# Patient Record
Sex: Male | Born: 1965 | Race: White | Hispanic: No | Marital: Single | Smoking: Never smoker
Health system: Southern US, Community
[De-identification: ages and names within clinical notes are randomized; demographics above are authoritative.]

## PROBLEM LIST (undated history)

## (undated) DIAGNOSIS — R51 Headache: Secondary | ICD-10-CM

## (undated) DIAGNOSIS — T7840XA Allergy, unspecified, initial encounter: Secondary | ICD-10-CM

## (undated) DIAGNOSIS — R519 Headache, unspecified: Secondary | ICD-10-CM

## (undated) HISTORY — DX: Allergy, unspecified, initial encounter: T78.40XA

## (undated) HISTORY — DX: Headache: R51

## (undated) HISTORY — PX: APPENDECTOMY: SHX54

## (undated) HISTORY — DX: Headache, unspecified: R51.9

## (undated) HISTORY — PX: EYE SURGERY: SHX253

## (undated) HISTORY — PX: NASAL FRACTURE SURGERY: SHX718

---

## 2007-07-20 ENCOUNTER — Emergency Department (HOSPITAL_COMMUNITY): Admission: EM | Admit: 2007-07-20 | Discharge: 2007-07-21 | Payer: Self-pay | Admitting: Emergency Medicine

## 2009-01-01 ENCOUNTER — Emergency Department (HOSPITAL_COMMUNITY): Admission: EM | Admit: 2009-01-01 | Discharge: 2009-01-01 | Payer: Self-pay | Admitting: Emergency Medicine

## 2009-05-11 IMAGING — CR DG CHEST 2V
2 series · 2 of 2 positions shown · non-contrast
Comparison: None

CLINICAL DATA: Chest pain.

CHEST - 2 VIEW

[w chest pa]
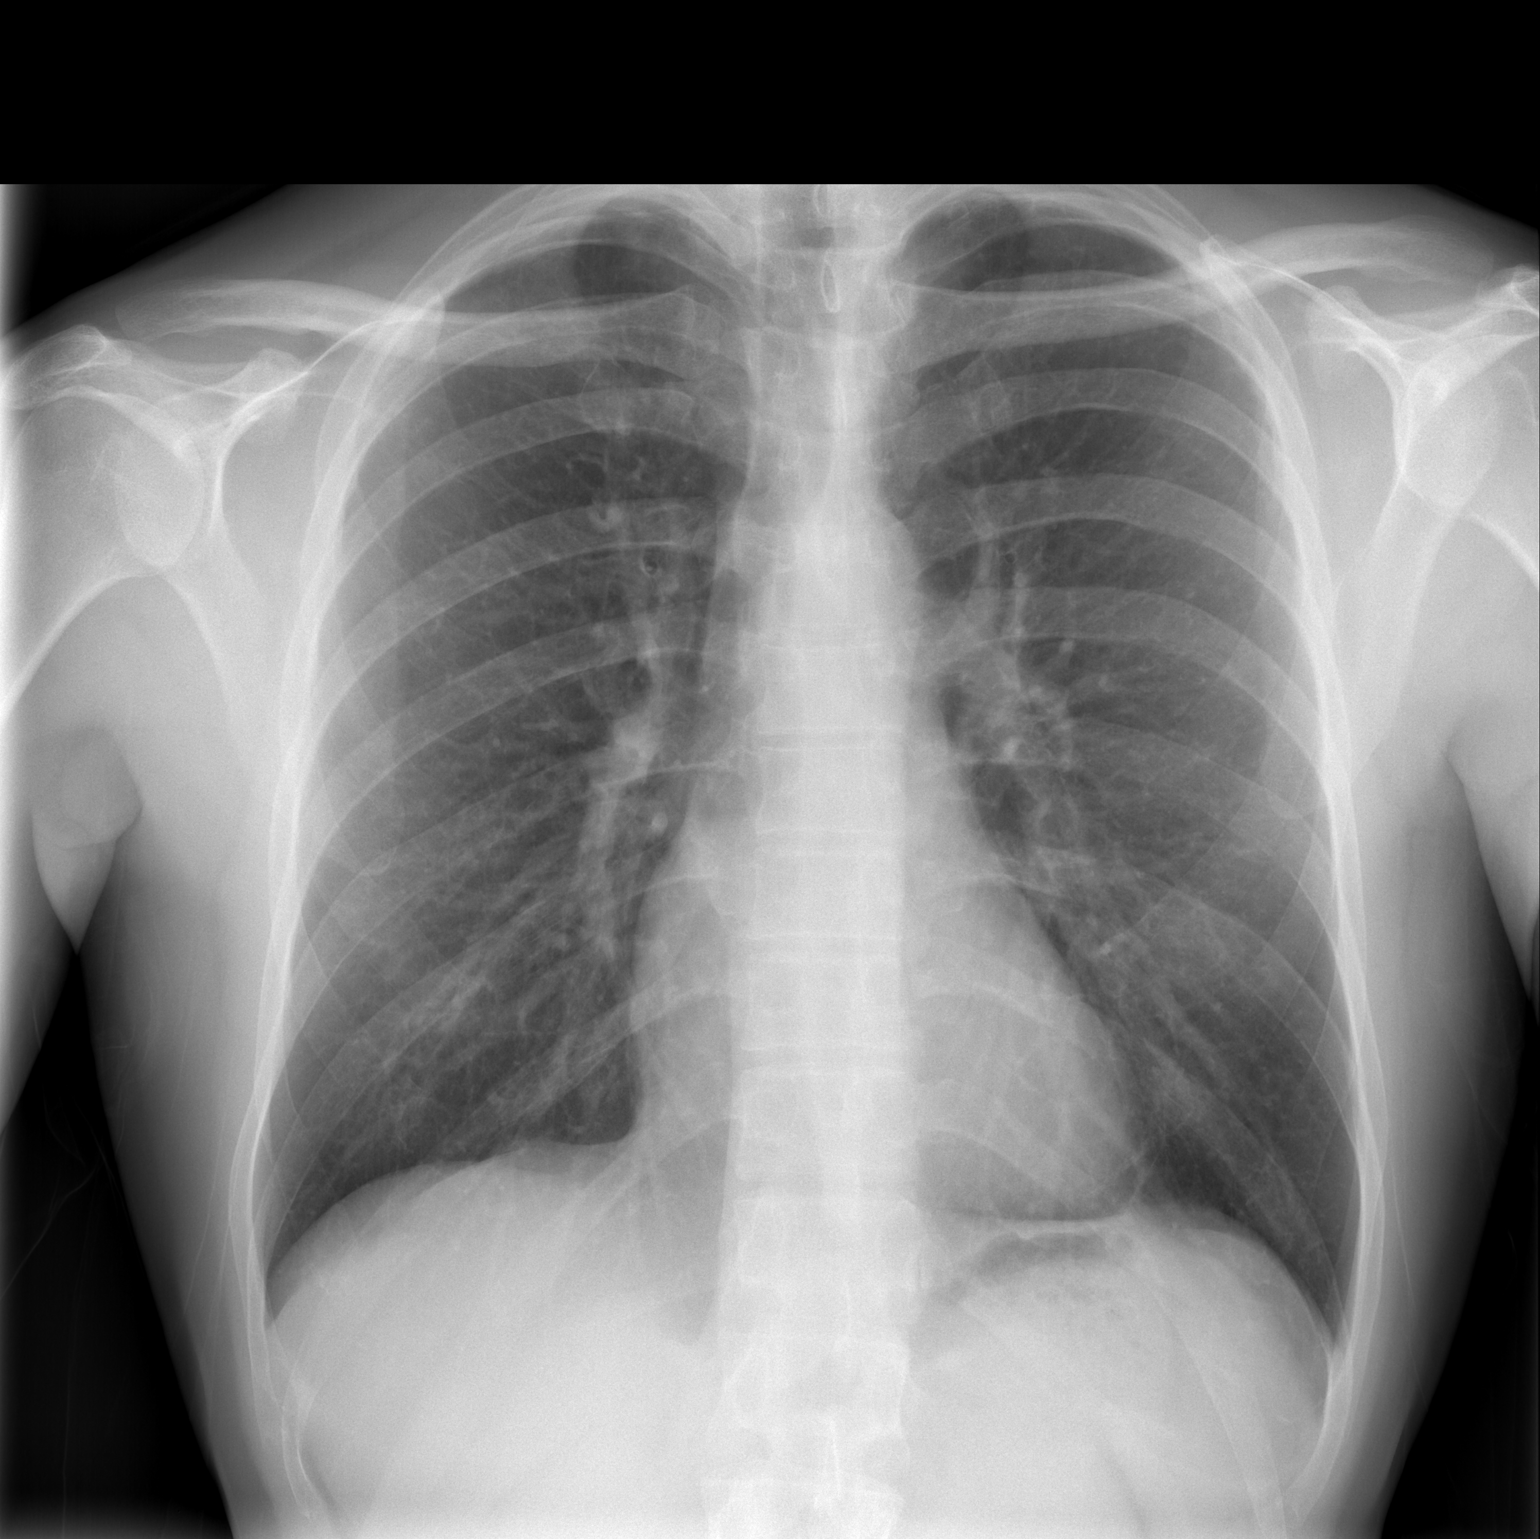

[w chest lat]
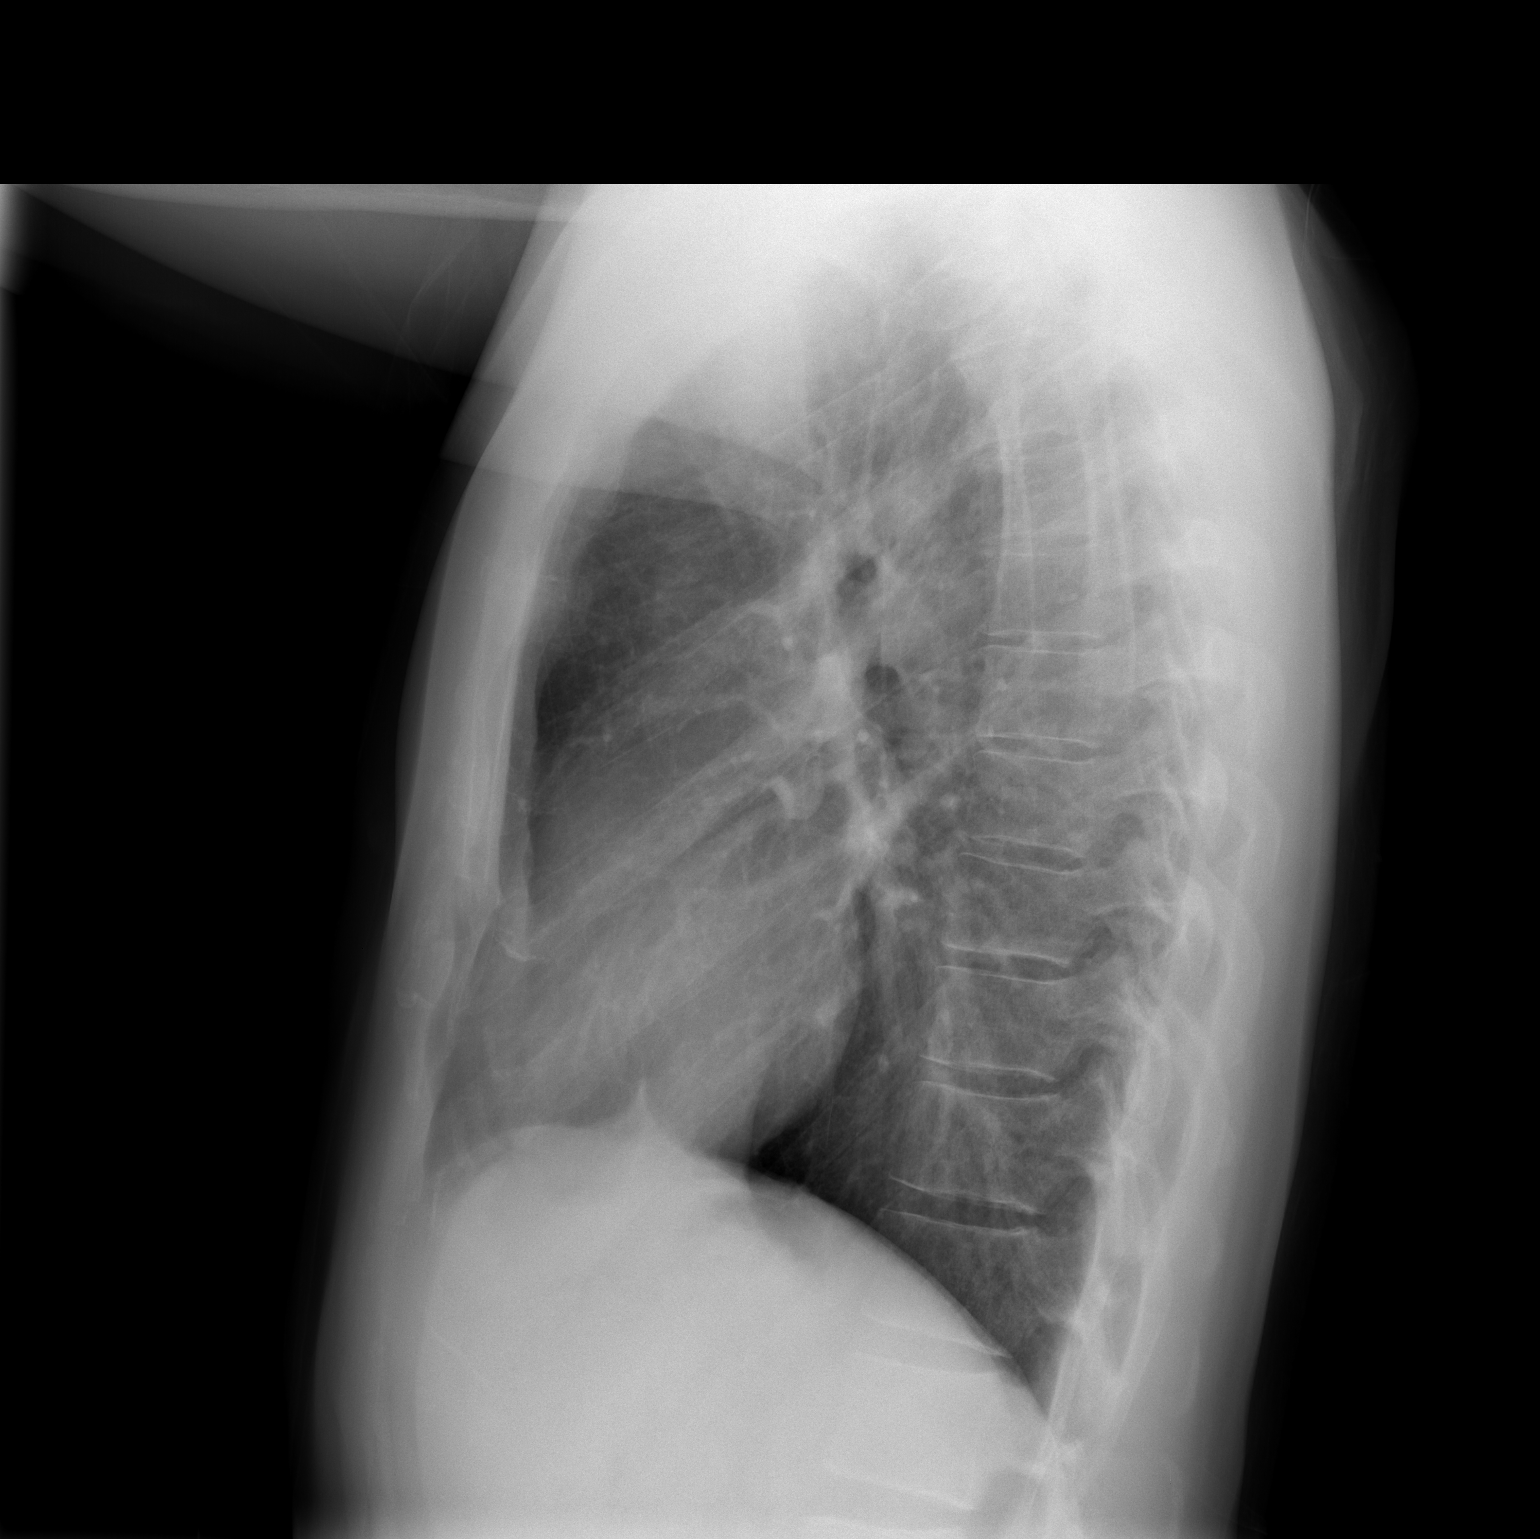

[2 of 2 positions shown; findings below may reference images not displayed]

FINDINGS: Minimal pectus excavatum deformity.  Biapical pleural
thickening. Midline trachea.  Normal heart size and mediastinal
contours.  No pleural effusion or pneumothorax.  Mild peribronchial
thickening.  Clear lungs.
IMPRESSION: 1.  No acute cardiopulmonary disease.
2.  Mild peribronchial thickening which may relate to chronic
bronchitis or smoking.

## 2010-06-21 LAB — POCT I-STAT, CHEM 8
BUN: 11 mg/dL (ref 6–23)
Chloride: 107 mEq/L (ref 96–112)
Creatinine, Ser: 1 mg/dL (ref 0.4–1.5)
Glucose, Bld: 156 mg/dL — ABNORMAL HIGH (ref 70–99)
Hemoglobin: 18.7 g/dL — ABNORMAL HIGH (ref 13.0–17.0)
Potassium: 3.3 mEq/L — ABNORMAL LOW (ref 3.5–5.1)
Sodium: 142 mEq/L (ref 135–145)

## 2010-09-19 ENCOUNTER — Emergency Department (HOSPITAL_COMMUNITY)
Admission: EM | Admit: 2010-09-19 | Discharge: 2010-09-19 | Disposition: A | Discharge status: Home / Self Care | Payer: 59 | Attending: Emergency Medicine | Admitting: Emergency Medicine

## 2010-09-19 DIAGNOSIS — L299 Pruritus, unspecified: Secondary | ICD-10-CM | POA: Insufficient documentation

## 2010-09-19 DIAGNOSIS — I10 Essential (primary) hypertension: Secondary | ICD-10-CM | POA: Insufficient documentation

## 2010-09-19 DIAGNOSIS — T7840XA Allergy, unspecified, initial encounter: Secondary | ICD-10-CM | POA: Insufficient documentation

## 2013-04-26 ENCOUNTER — Ambulatory Visit (INDEPENDENT_AMBULATORY_CARE_PROVIDER_SITE_OTHER): Payer: 59 | Admitting: Physician Assistant

## 2013-04-26 VITALS — BP 128/80 | HR 73 | Temp 98.0°F | Resp 16 | Ht 72.5 in | Wt 170.0 lb

## 2013-04-26 DIAGNOSIS — R51 Headache: Secondary | ICD-10-CM

## 2013-04-26 DIAGNOSIS — R519 Headache, unspecified: Secondary | ICD-10-CM | POA: Insufficient documentation

## 2013-04-26 DIAGNOSIS — R197 Diarrhea, unspecified: Secondary | ICD-10-CM

## 2013-04-26 DIAGNOSIS — R112 Nausea with vomiting, unspecified: Secondary | ICD-10-CM

## 2013-04-26 NOTE — Progress Notes (Signed)
Subjective:    Patient ID: Bernard Flores, Bernard Flores    DOB: 09/14/1965, 48 y.o.   MRN: [id number]  PCP: No primary provider on file.  Chief Complaint  Patient presents with  . Nausea    x 2 days  . Emesis  . Otalgia    right      Active Ambulatory Problems    Diagnosis Date Noted  . No Active Ambulatory Problems   Resolved Ambulatory Problems    Diagnosis Date Noted  . No Resolved Ambulatory Problems   Past Medical History  Diagnosis Date  . Allergy     Past Surgical History  Procedure Laterality Date  . Appendectomy    . Eye surgery    . Nasal fracture surgery      No Known Allergies  Prior to Admission medications   Not on File    History   Social History  . Marital Status: Single    Spouse Name: n/a    Number of Children: 0  . Years of Education: 12   Occupational History  . [employer]    Social History Main Topics  . Smoking status: Never Smoker   . Smokeless tobacco: Never Used  . Alcohol Use: Yes     Comment: occasionally  . Drug Use: No  . Sexual Activity: None   Other Topics Concern  . None   Social History Narrative   Lives alone.    family history includes Alcohol abuse in his father; Heart disease in his father; Hypertension in his mother. indicated that his mother is alive. He indicated that his father is deceased. He indicated that his sister is alive.   HPI  About 1 week ago, developed pain in the RIGHT ear.  Seemed to spread down to the jaw and into the cheek. Felt like a sinus infection.  On 2/07 developed HA (his HA are often associated with nausea and vomiting) aches, fatigue, nausea and vomiting and diarrhea. After vomiting 4-5 times seemed some better.  Diarrhea continued through this morning.  Has eaten only a bagel today.  No subsequent diarrhea today and no vomiting today. Used TheraFlu with success temporarily.   No coughing, sneezing. This doesn't feel like bronchitis he's had before. Ear pain is nearly resolved.  No  sore throat. Has been eating more chicken lately, concerned that he may have developed salmonella. Diarrhea without blood or mucous.  Diaphoresis and cold sweats with vomiting, but none since then.  Review of Systems As above.  No CP, SOB, dizziness.  No rash. No unexplained joint or muscle pain.    Objective:   Physical Exam  Blood pressure 128/80, pulse 73, temperature 98 F (36.7 C), temperature source Oral, resp. rate 16, height 6' 0.5" (1.842 m), weight 170 lb (77.111 kg), SpO2 97.00%. Body mass index is 22.73 kg/(m^2). Well-developed, well nourished WM who is awake, alert and oriented, in NAD. HEENT: [place]/AT, PERRL, EOMI.  Sclera and conjunctiva are clear.  EAC are patent, TMs are normal in appearance. Nasal mucosa is pink and moist. OP is clear. Frontal and maxillary sinuses are non-tender. Neck: supple, non-tender, no lymphadenopathy, thyromegaly. Heart: RRR, no murmur Lungs: normal effort, CTA Abdomen: normo-active bowel sounds, supple, non-tender, no mass or organomegaly. Extremities: no cyanosis, clubbing or edema. Skin: warm and dry without rash. Psychologic: good mood and appropriate affect, normal speech and behavior.       Assessment & Plan:  1. Nausea with vomiting 2. Diarrhea Likely resolving viral gastroenteritis.  Anticipatory  guidance. Rest. Fluids. RTC if symptoms worsen/persist.   Fernande Bras, PA-C Physician Assistant-Certified [facility]

## 2013-04-26 NOTE — Patient Instructions (Signed)
Get plenty of rest and drink at least 64 ounces of water daily. Your stools will return to normal once you can resume your regular diet. Try some saline nasal spray to help your sinuses.
